# Patient Record
Sex: Male | Born: 1955
Health system: Southern US, Community
[De-identification: ages and names within clinical notes are randomized; demographics above are authoritative.]

## PROBLEM LIST (undated history)

## (undated) DIAGNOSIS — E119 Type 2 diabetes mellitus without complications: Secondary | ICD-10-CM

## (undated) DIAGNOSIS — E785 Hyperlipidemia, unspecified: Secondary | ICD-10-CM

## (undated) DIAGNOSIS — I10 Essential (primary) hypertension: Secondary | ICD-10-CM

## (undated) HISTORY — DX: Essential (primary) hypertension: I10

## (undated) HISTORY — DX: Hyperlipidemia, unspecified: E78.5

## (undated) HISTORY — DX: Type 2 diabetes mellitus without complications: E11.9

---

## 2011-04-21 DIAGNOSIS — E119 Type 2 diabetes mellitus without complications: Secondary | ICD-10-CM

## 2011-04-21 HISTORY — DX: Type 2 diabetes mellitus without complications: E11.9

## 2012-05-20 ENCOUNTER — Other Ambulatory Visit: Payer: Self-pay | Admitting: Family Medicine

## 2012-05-20 DIAGNOSIS — R109 Unspecified abdominal pain: Secondary | ICD-10-CM

## 2012-05-23 ENCOUNTER — Ambulatory Visit
Admission: RE | Admit: 2012-05-23 | Discharge: 2012-05-23 | Disposition: A | Discharge status: Home / Self Care | Payer: BC Managed Care – PPO | Source: Ambulatory Visit | Attending: Family Medicine | Admitting: Family Medicine

## 2012-05-23 DIAGNOSIS — R109 Unspecified abdominal pain: Secondary | ICD-10-CM

## 2013-11-07 ENCOUNTER — Other Ambulatory Visit: Payer: Self-pay | Admitting: *Deleted

## 2013-11-07 DIAGNOSIS — R002 Palpitations: Secondary | ICD-10-CM

## 2013-11-10 ENCOUNTER — Encounter (INDEPENDENT_AMBULATORY_CARE_PROVIDER_SITE_OTHER): Payer: BC Managed Care – PPO

## 2013-11-10 ENCOUNTER — Encounter: Payer: Self-pay | Admitting: Radiology

## 2013-11-10 DIAGNOSIS — R002 Palpitations: Secondary | ICD-10-CM

## 2013-11-10 NOTE — Progress Notes (Signed)
Patient ID: [name], male   DOB: [birth year], 58 y.o.   MRN: [id number] E cardio 24 hr holter monitor applied

## 2013-12-08 ENCOUNTER — Other Ambulatory Visit (HOSPITAL_COMMUNITY): Payer: Self-pay | Admitting: Family Medicine

## 2013-12-08 DIAGNOSIS — R002 Palpitations: Secondary | ICD-10-CM

## 2013-12-08 IMAGING — US US ABDOMEN COMPLETE
1 series · 14 of 25 positions shown · non-contrast
Comparison: None.

CLINICAL DATA: Abdominal pain

COMPLETE ABDOMINAL ULTRASOUND

[Series 1: us abdomen complete · 0.35mm/px · 14 of 87 slices shown]
[im 1/87]
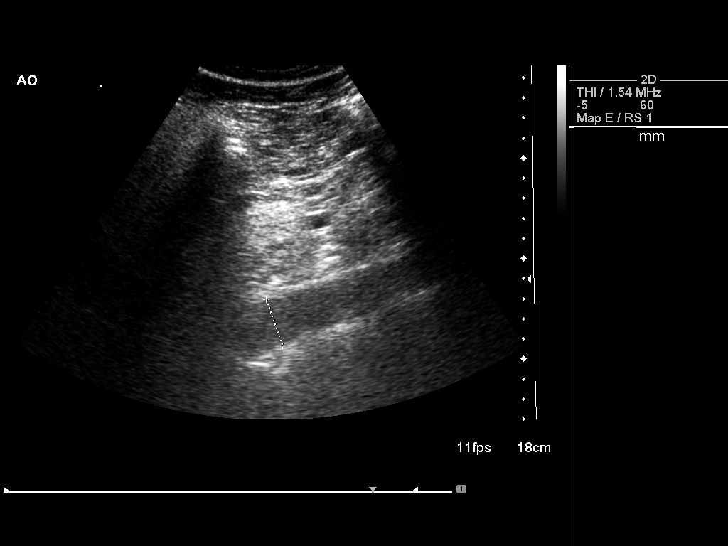
[im 8/87]
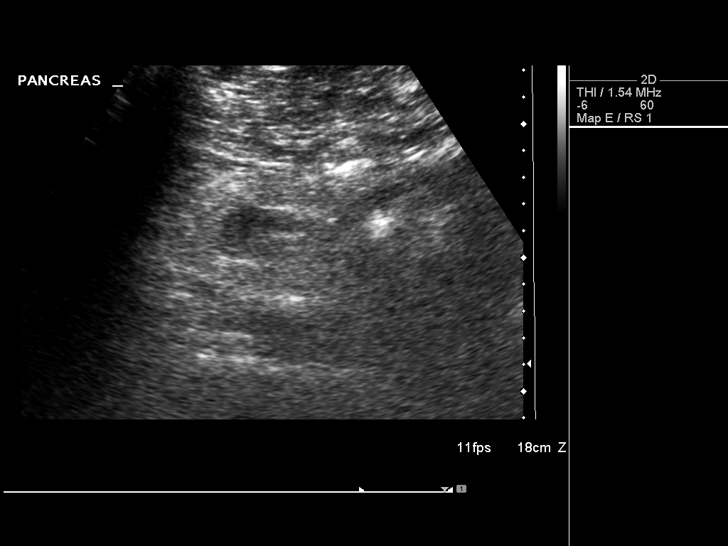
[im 15/87]
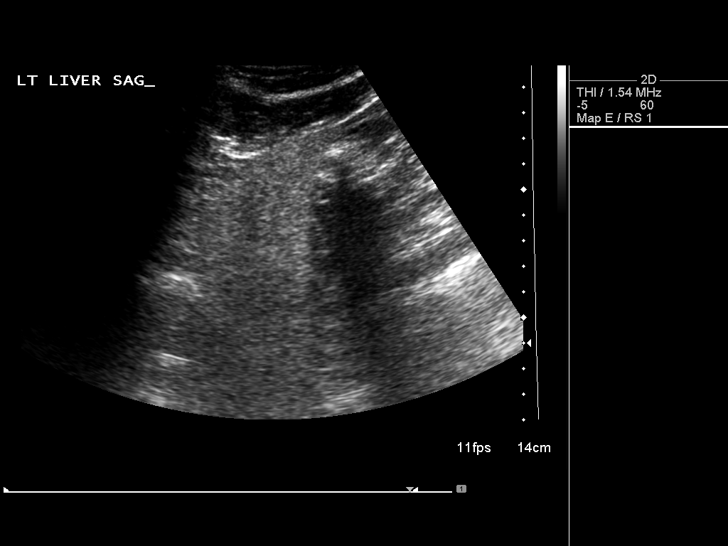
[im 22/87]
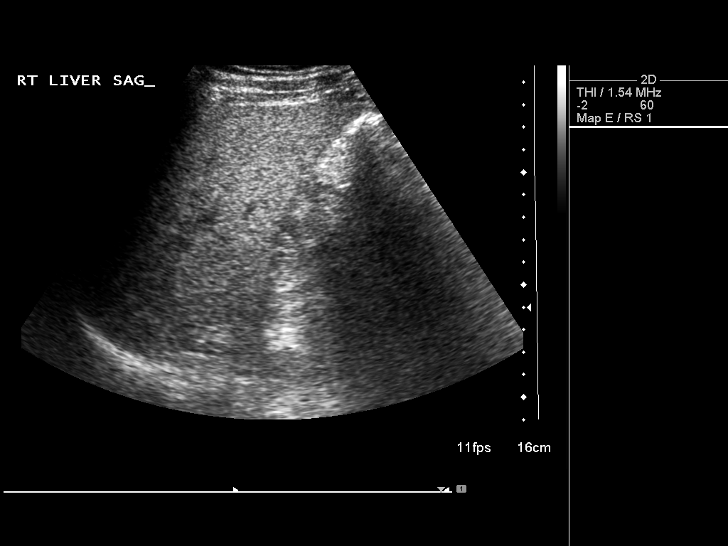
[im 29/87]
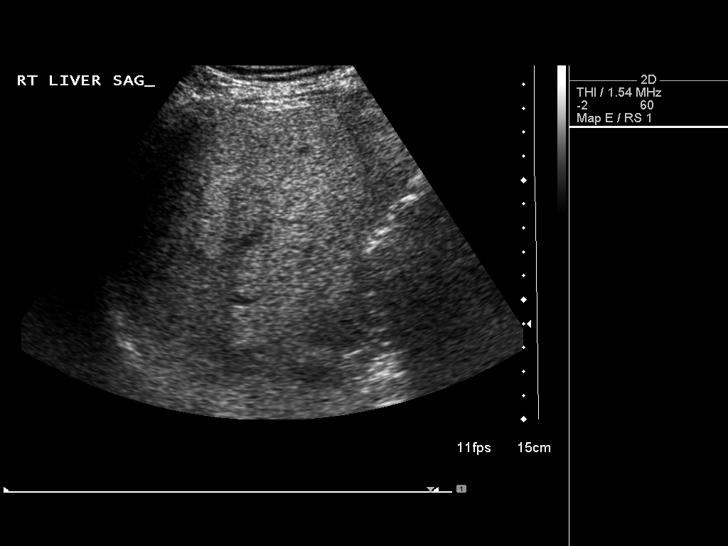
[im 33/87]
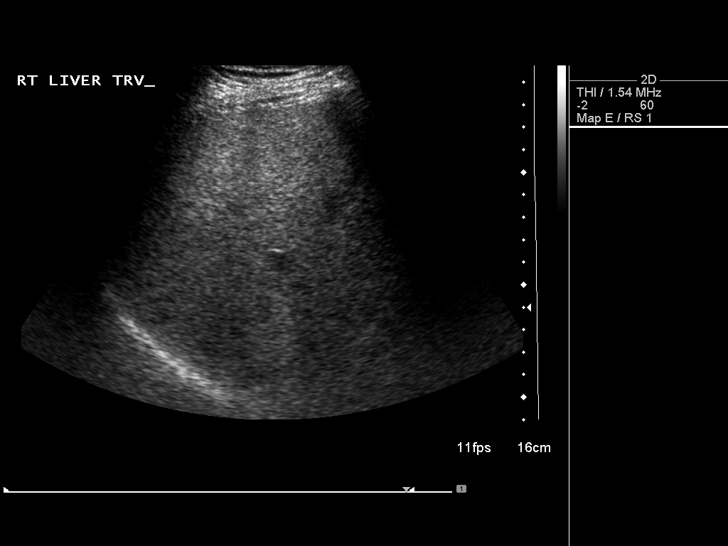
[im 40/87]
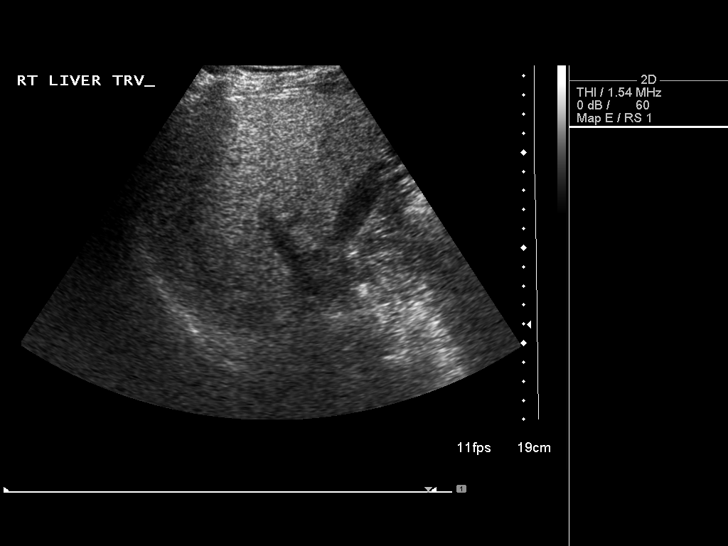
[im 47/87]
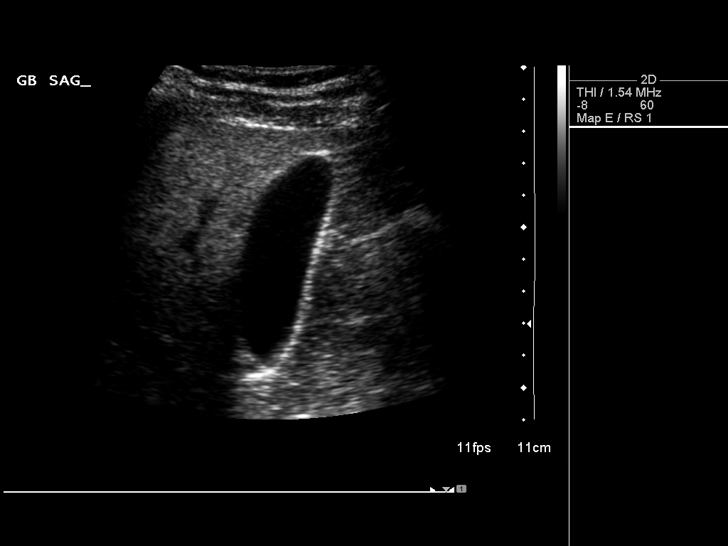
[im 54/87]
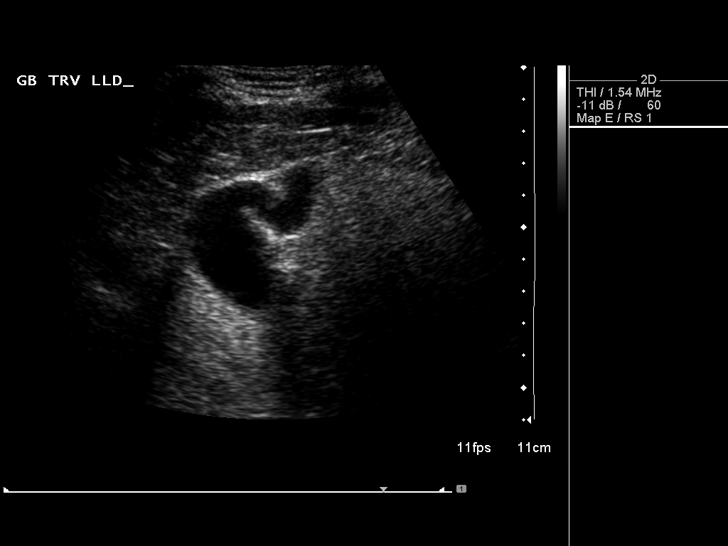
[im 58/87]
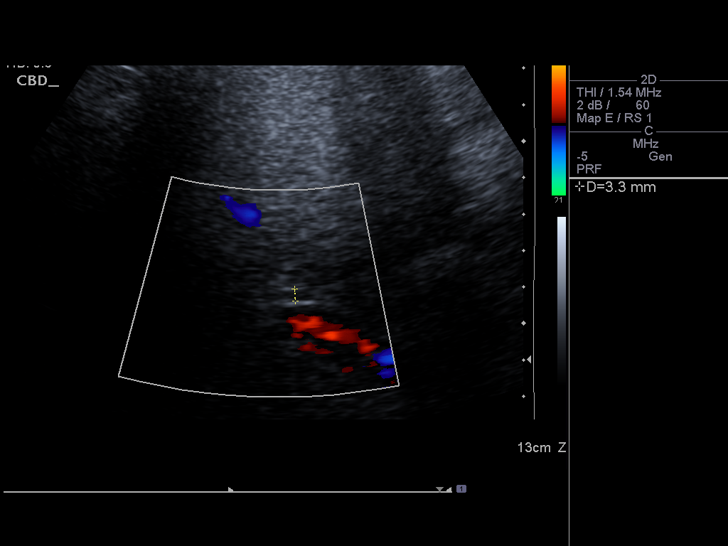
[im 65/87]
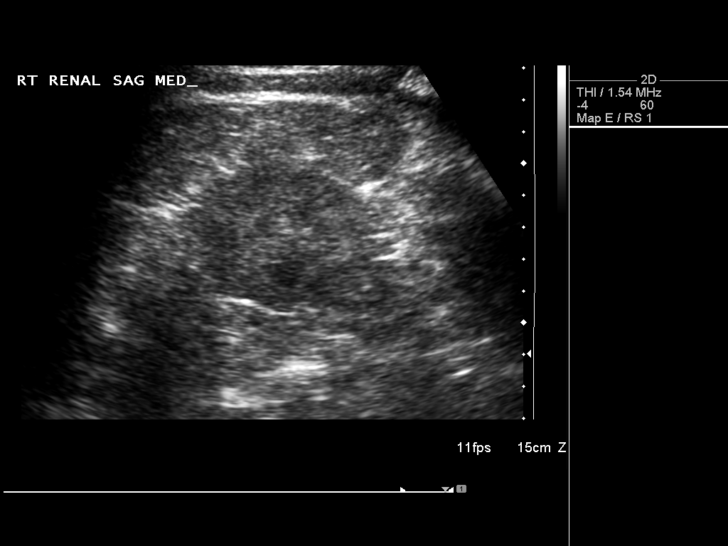
[im 72/87]
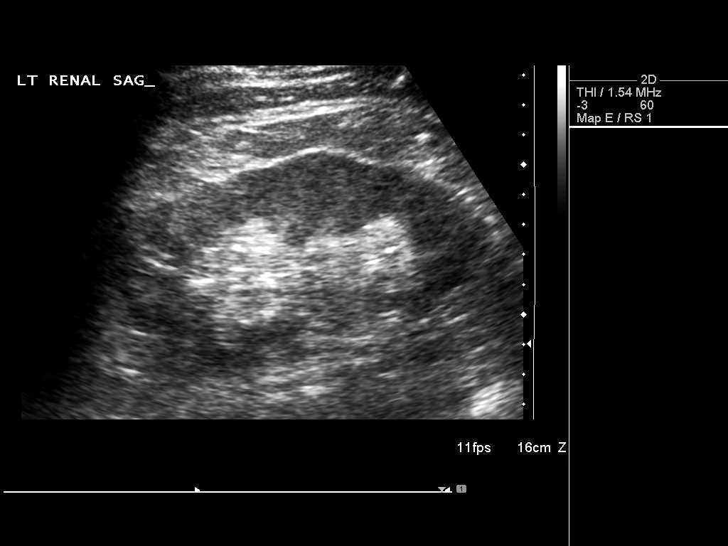
[im 79/87]
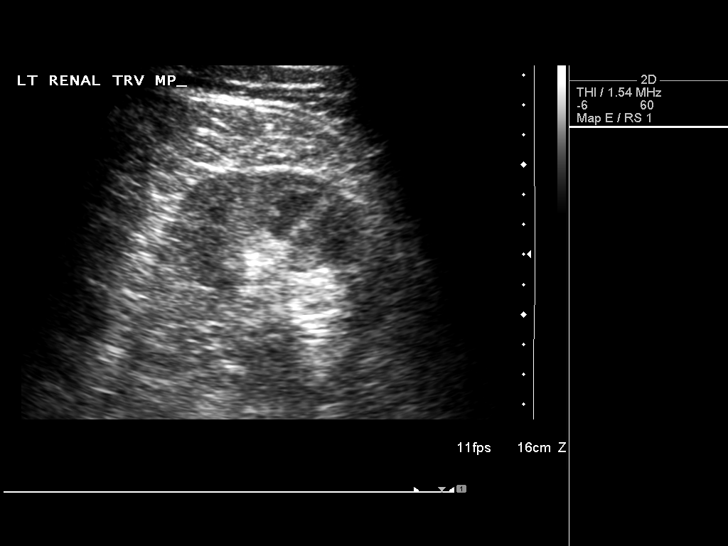
[im 87/87]
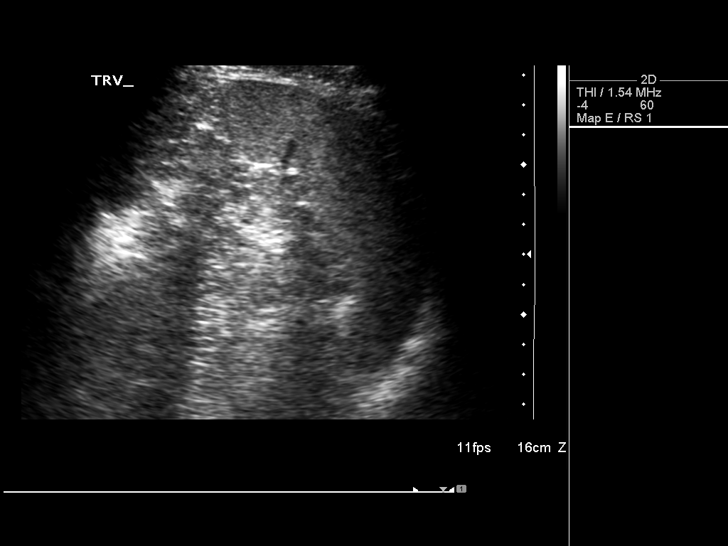

[14 of 25 positions shown; findings below may reference images not displayed]

FINDINGS: Gallbladder:  The gallbladder is visualized and no gallstones are
noted.  There is no pain over the gallbladder with compression.

Common bile duct:  The common bile duct is normal measuring 3.3 mm
in diameter.

Liver:  The liver is echogenic diffusely consistent with fatty
infiltration.  No focal abnormality is seen.

IVC:  The IVC is obscured by bowel gas.

Pancreas:  The pancreas also is largely obscured by bowel gas.

Spleen:  The spleen is normal measuring 5.9 cm sagittally.

Right Kidney:  No hydronephrosis is seen.  The right kidney
measures 10.3 cm sagittally.

Left Kidney:  No hydronephrosis is noted.  The left kidney is
slightly larger measuring 12.3 cm.

Abdominal aorta:  The abdominal aorta is normal in caliber.
IMPRESSION: 1.  Echogenic liver consistent with diffuse fatty infiltration.  No
focal abnormality.
2.  No gallstones.
3.  The left kidney is 2 cm larger than the right, of questionable
significance.

## 2013-12-18 ENCOUNTER — Inpatient Hospital Stay (HOSPITAL_COMMUNITY): Admission: RE | Admit: 2013-12-18 | Payer: BC Managed Care – PPO | Source: Ambulatory Visit

## 2014-01-05 ENCOUNTER — Institutional Professional Consult (permissible substitution): Payer: BC Managed Care – PPO | Admitting: Cardiology

## 2014-01-29 ENCOUNTER — Encounter: Payer: Self-pay | Admitting: *Deleted

## 2014-02-02 ENCOUNTER — Institutional Professional Consult (permissible substitution): Payer: BC Managed Care – PPO | Admitting: Cardiology

## 2014-04-04 ENCOUNTER — Ambulatory Visit (HOSPITAL_COMMUNITY): Payer: BC Managed Care – PPO | Attending: Family Medicine | Admitting: Cardiology

## 2014-04-04 ENCOUNTER — Other Ambulatory Visit (HOSPITAL_COMMUNITY): Payer: Self-pay | Admitting: Family Medicine

## 2014-04-04 DIAGNOSIS — E119 Type 2 diabetes mellitus without complications: Secondary | ICD-10-CM | POA: Insufficient documentation

## 2014-04-04 DIAGNOSIS — I10 Essential (primary) hypertension: Secondary | ICD-10-CM | POA: Diagnosis not present

## 2014-04-04 DIAGNOSIS — R0609 Other forms of dyspnea: Secondary | ICD-10-CM | POA: Insufficient documentation

## 2014-04-04 DIAGNOSIS — E785 Hyperlipidemia, unspecified: Secondary | ICD-10-CM | POA: Diagnosis not present

## 2014-04-04 DIAGNOSIS — R002 Palpitations: Secondary | ICD-10-CM

## 2014-04-04 NOTE — Progress Notes (Signed)
Echo performed. 

## 2014-04-24 ENCOUNTER — Telehealth: Payer: Self-pay | Admitting: Internal Medicine

## 2014-04-24 NOTE — Telephone Encounter (Signed)
[facility] [place] (Dr Lupita RaiderKimberlee Shaw) for appointment with Dr Rennis GoldenHilty on 05/03/14.  [facility] Dr Blanchie DessertHilty's schedule on 05/03/14.  lp

## 2014-05-03 ENCOUNTER — Ambulatory Visit: Payer: Self-pay | Admitting: Internal Medicine

## 2014-05-25 ENCOUNTER — Encounter: Payer: Self-pay | Admitting: Internal Medicine

## 2014-05-25 ENCOUNTER — Ambulatory Visit (INDEPENDENT_AMBULATORY_CARE_PROVIDER_SITE_OTHER): Payer: Self-pay | Admitting: Internal Medicine

## 2014-05-25 VITALS — BP 146/88 | HR 91 | Ht 69.0 in | Wt 195.0 lb

## 2014-05-25 DIAGNOSIS — I5189 Other ill-defined heart diseases: Secondary | ICD-10-CM

## 2014-05-25 DIAGNOSIS — I493 Ventricular premature depolarization: Secondary | ICD-10-CM

## 2014-05-25 DIAGNOSIS — Z8249 Family history of ischemic heart disease and other diseases of the circulatory system: Secondary | ICD-10-CM

## 2014-05-25 DIAGNOSIS — E119 Type 2 diabetes mellitus without complications: Secondary | ICD-10-CM

## 2014-05-25 DIAGNOSIS — E785 Hyperlipidemia, unspecified: Secondary | ICD-10-CM

## 2014-05-25 DIAGNOSIS — R0602 Shortness of breath: Secondary | ICD-10-CM

## 2014-05-25 DIAGNOSIS — R000 Tachycardia, unspecified: Secondary | ICD-10-CM

## 2014-05-25 DIAGNOSIS — E349 Endocrine disorder, unspecified: Secondary | ICD-10-CM

## 2014-05-25 DIAGNOSIS — Z794 Long term (current) use of insulin: Secondary | ICD-10-CM

## 2014-05-25 DIAGNOSIS — I519 Heart disease, unspecified: Secondary | ICD-10-CM

## 2014-05-25 DIAGNOSIS — R0609 Other forms of dyspnea: Secondary | ICD-10-CM

## 2014-05-25 DIAGNOSIS — E291 Testicular hypofunction: Secondary | ICD-10-CM

## 2014-05-25 DIAGNOSIS — IMO0001 Reserved for inherently not codable concepts without codable children: Secondary | ICD-10-CM

## 2014-05-25 DIAGNOSIS — I10 Essential (primary) hypertension: Secondary | ICD-10-CM

## 2014-05-25 NOTE — Patient Instructions (Signed)
Your physician has requested that you have en exercise stress myoview. For further information please visit [email or web address]. Please follow instruction sheet, as given.  Your physician recommends that you schedule a follow-up appointment after your stress test.   

## 2014-05-25 NOTE — Progress Notes (Signed)
OFFICE NOTE  Chief Complaint:  Dyspnea, indigestion, tachycardia, palpitations  Primary Care Physician: Danny Raider, MD  HPI:  [name] is a pleasant 59 year old males calmly referred to me by Dr. Clelia [name] for evaluation of progressive shortness of breath and palpitations. Last summer he had the onset of new palpitations and wore a 48-hour monitor. This demonstrated about 500 PVCs which were isolated. No nonsustained VT was noted. Subsequently had an echocardiogram which demonstrated an EF of 65-70% with diastolic dysfunction. There is no significant LV wall thickening but some mild proximal septal thickening. No clear etiology of his diastolic dysfunction other than possibly his history of insulin-dependent diabetes and hypertension. He also has risk factors for heart disease including dyslipidemia and a strong family history of heart disease. His father died of a heart attack at age 27. He also has 2 brothers with significant dyslipidemia and has a personal history of markedly elevated triglycerides. He's currently on Vytorin and fenofibrate. He reports his dyspnea has gotten significantly worse with exertion. He also reports tachycardia which starts much after minimal activity. He's also much more fatigued. He occasionally gets lightheaded or dizzy if he overexerts himself. He denies any angina.  PMHx:  Past Medical History  Diagnosis Date  . Hypertension   . Hyperlipidemia   . Diabetes mellitus, type 2 04/2011    No past surgical history on file.  FAMHx:  Family History  Problem Relation Age of Onset  . Heart attack Father 46  . Sudden death Mother 54    SOCHx:   reports that he has never smoked. He has never used smokeless tobacco. He reports that he does not drink alcohol or use illicit drugs.  ALLERGIES:  No Known Allergies  ROS: A comprehensive review of systems was negative except for: Respiratory: positive for dyspnea on exertion Cardiovascular: positive for  fatigue and palpitations Neurological: positive for dizziness  HOME MEDS: Current Outpatient Prescriptions  Medication Sig Dispense Refill  . aspirin 81 MG tablet Take 81 mg by mouth daily.    Randa Ngo 30 MG/ACT SOLN Apply 1 pump every morning after shower to each underarm  0  . Coenzyme Q10 (CO Q-10) 100 MG CAPS Take 1 capsule by mouth daily.    . fenofibrate 160 MG tablet Take 160 mg by mouth daily.    Marland Kitchen LANTUS SOLOSTAR 100 UNIT/ML Solostar Pen Take 28 Units by mouth every morning.  0  . losartan-hydrochlorothiazide (HYZAAR) 50-12.5 MG per tablet Take 1 tablet by mouth daily.    . metFORMIN (GLUCOPHAGE) 500 MG tablet Take 1 tablet by mouth 2 (two) times daily.    . Omega-3 Fatty Acids (FISH OIL) 1000 MG CAPS Take by mouth daily.    Marland Kitchen omeprazole (PRILOSEC) 20 MG capsule Take 1 capsule by mouth daily.    . TRULICITY 1.5 MG/0.5ML SOPN every 7 (seven) days.  1  . VYTORIN 10-20 MG per tablet Take 1 tablet by mouth daily.  1   No current facility-administered medications for this visit.    LABS/IMAGING: No results found for this or any previous visit (from the past 48 hour(s)). No results found.  VITALS: BP 146/88 mmHg  Pulse 91  Ht  (1.753 m)  Wt 195 lb (88.451 kg)  BMI 28.78 kg/m2  EXAM: General appearance: alert and no distress Neck: no carotid bruit and no JVD Lungs: clear to auscultation bilaterally Heart: regular rate and rhythm, S1, S2 normal, no murmur, click, rub or gallop Abdomen: soft, non-tender; bowel sounds  normal; no masses,  no organomegaly Extremities: extremities normal, atraumatic, no cyanosis or edema Pulses: 2+ and symmetric Skin: Skin color, texture, turgor normal. No rashes or lesions Neurologic: Grossly normal Psych: Pleasant  EKG: Sinus rhythm with occasional PVCs at 91, left axis deviation, PRWP, cannot rule out anterior infarct  ASSESSMENT: 1. Progressive dyspnea on exertion and PVCs 2. Abnormal EKG 3. Insulin dependent  diabetes 4. Hypertension 5. Dyslipidemia 6. Diastolic dysfunction with preserved systolic function on recent echo  PLAN: 1.   Mr. [name] is multiple coronary risk factors and abnormalities on his EKG with diastolic dysfunction on his echo. He's had progressive shortness of breath but no significant chest pain. He is also experiencing fatigue which sounds like it could be related to multivessel coronary disease. I would recommend an exercise nuclear stress test to see if we can re-create his symptoms and evaluate for ischemia as a possible cause. Plan to see him back to discuss results of the stress test when it's available.  Thanks as always for the kind referral.  Chrystie NoseKenneth C. Sharna Gabrys, MD, [facility] Attending Cardiologist [facility]  [name] C 05/25/2014, 5:23 PM

## 2014-05-30 ENCOUNTER — Telehealth (HOSPITAL_COMMUNITY): Payer: Self-pay

## 2014-05-30 NOTE — Telephone Encounter (Signed)
Encounter complete. 

## 2014-05-31 ENCOUNTER — Telehealth (HOSPITAL_COMMUNITY): Payer: Self-pay

## 2014-05-31 NOTE — Telephone Encounter (Signed)
Encounter complete. 

## 2014-06-01 ENCOUNTER — Ambulatory Visit (HOSPITAL_COMMUNITY)
Admission: RE | Admit: 2014-06-01 | Discharge: 2014-06-01 | Disposition: A | Discharge status: Home / Self Care | Payer: BLUE CROSS/BLUE SHIELD | Source: Ambulatory Visit | Attending: Cardiovascular Disease | Admitting: Cardiovascular Disease

## 2014-06-01 DIAGNOSIS — R55 Syncope and collapse: Secondary | ICD-10-CM | POA: Diagnosis not present

## 2014-06-01 DIAGNOSIS — I10 Essential (primary) hypertension: Secondary | ICD-10-CM | POA: Insufficient documentation

## 2014-06-01 DIAGNOSIS — R0602 Shortness of breath: Secondary | ICD-10-CM | POA: Diagnosis not present

## 2014-06-01 DIAGNOSIS — R002 Palpitations: Secondary | ICD-10-CM | POA: Diagnosis not present

## 2014-06-01 DIAGNOSIS — E785 Hyperlipidemia, unspecified: Secondary | ICD-10-CM | POA: Diagnosis not present

## 2014-06-01 DIAGNOSIS — R61 Generalized hyperhidrosis: Secondary | ICD-10-CM | POA: Insufficient documentation

## 2014-06-01 DIAGNOSIS — E669 Obesity, unspecified: Secondary | ICD-10-CM | POA: Diagnosis not present

## 2014-06-01 DIAGNOSIS — R9431 Abnormal electrocardiogram [ECG] [EKG]: Secondary | ICD-10-CM | POA: Diagnosis not present

## 2014-06-01 DIAGNOSIS — E118 Type 2 diabetes mellitus with unspecified complications: Secondary | ICD-10-CM | POA: Insufficient documentation

## 2014-06-01 DIAGNOSIS — Z8249 Family history of ischemic heart disease and other diseases of the circulatory system: Secondary | ICD-10-CM | POA: Diagnosis not present

## 2014-06-01 DIAGNOSIS — I5189 Other ill-defined heart diseases: Secondary | ICD-10-CM

## 2014-06-01 DIAGNOSIS — R0609 Other forms of dyspnea: Secondary | ICD-10-CM | POA: Insufficient documentation

## 2014-06-01 DIAGNOSIS — R42 Dizziness and giddiness: Secondary | ICD-10-CM | POA: Insufficient documentation

## 2014-06-01 DIAGNOSIS — R000 Tachycardia, unspecified: Secondary | ICD-10-CM

## 2014-06-01 DIAGNOSIS — IMO0001 Reserved for inherently not codable concepts without codable children: Secondary | ICD-10-CM

## 2014-06-01 DIAGNOSIS — Z794 Long term (current) use of insulin: Secondary | ICD-10-CM | POA: Diagnosis not present

## 2014-06-01 DIAGNOSIS — E119 Type 2 diabetes mellitus without complications: Secondary | ICD-10-CM

## 2014-06-01 DIAGNOSIS — I493 Ventricular premature depolarization: Secondary | ICD-10-CM

## 2014-06-01 DIAGNOSIS — I519 Heart disease, unspecified: Secondary | ICD-10-CM

## 2014-06-01 MED ORDER — TECHNETIUM TC 99M SESTAMIBI GENERIC - CARDIOLITE
10.3000 | Freq: Once | INTRAVENOUS | Status: AC | PRN
  Administered 2014-06-01: 10 via INTRAVENOUS

## 2014-06-01 MED ORDER — TECHNETIUM TC 99M SESTAMIBI GENERIC - CARDIOLITE
32.0000 | Freq: Once | INTRAVENOUS | Status: AC | PRN
  Administered 2014-06-01: 32 via INTRAVENOUS

## 2014-06-01 NOTE — Procedures (Addendum)
[facility] [address] [place] [address] [phone]  Cardiology Nuclear Med Study  Danny SkeenRoyce [name] is a 59 y.o. male     MRN : [id number]     DOB: May 23, 1955  Procedure Date: 06/01/2014  Nuclear Med Background Indication for Stress Test:  Evaluation for Ischemia and Abnormal EKG History:  No prior cardiac or respiratory history reported;No prior NUC MPI for comparison/ Cardiac Risk Factors: Family History - CAD, Hypertension, IDDM Type 2, Lipids and Overweight  Symptoms:  Diaphoresis, Dizziness, DOE, Fatigue, Light-Headedness, Near Syncope, Palpitations and SOB   Nuclear Pre-Procedure Caffeine/Decaff Intake:  7:00pm NPO After: 5:00am   IV Site: R Forearm  IV 0.9% NS with Angio Cath:  22g  Chest Size (in):  48" IV Started by: Berdie OgrenAmanda Wease, RN  Height: 5\' 9"  (1.753 m)  Cup Size: n/a  BMI:  Body mass index is 28.78 kg/(m^2). Weight:  195 lb (88.451 kg)   Tech Comments:  n/a    Nuclear Med Study 1 or 2 day study: 1 day  Stress Test Type:  Stress  Order Authorizing Provider:  Zoila ShutterKenneth Hilty, MD   Resting Radionuclide: Technetium 1838m Sestamibi  Resting Radionuclide Dose: 10.3 mCi   Stress Radionuclide:  Technetium 1338m Sestamibi  Stress Radionuclide Dose: 32.0 mCi           Stress Protocol Rest HR: 88 Stress HR: 153  Rest BP: 155/84 Stress BP: 202/97  Exercise Time (min): 9. METS: 10.1   Predicted Max HR: 163 bpm % Max HR: 93.87 bpm Rate Pressure Product: [id number]  Dose of Adenosine (mg):  n/a Dose of Lexiscan: n/a mg  Dose of Atropine (mg): n/a Dose of Dobutamine: n/a mcg/kg/min (at max HR)  Stress Test Technologist: Esperanza Sheetserry-Marie Martin, CCT Nuclear Technologist: Gonzella LexPam Phillips, CNMT   Rest Procedure:  Myocardial perfusion imaging was performed at rest 45 minutes following the intravenous administration of Technetium 4638m Sestamibi. Stress Procedure:  The patient performed treadmill exercise using a Bruce  Protocol  for 9 minutes. The patient stopped due to Fatigue, SOB and Leg Fatigue and denied any chest pain.  There were no significant ST-T wave changes.  Technetium 338m Sestamibi was injected IV at peak exercise and myocardial perfusion imaging was performed after a brief delay.  Transient Ischemic Dilatation (Normal <1.22):  0.93  QGS EDV:  68 ml QGS ESV:  23 ml LV Ejection Fraction: 67%       Rest ECG: NSR - Normal EKG  Stress ECG: No significant change from baseline ECG  QPS Raw Data Images:  Normal; no motion artifact; normal heart/lung ratio. Stress Images:  There is decreased uptake in the inferior wall. Rest Images:  There is decreased uptake in the inferior wall. Subtraction (SDS):  There is a fixed inferior defect that is most consistent with diaphragmatic attenuation.  Impression Exercise Capacity:  Good exercise capacity. BP Response:  Normal blood pressure response. Clinical Symptoms:  No significant symptoms noted. ECG Impression:  No significant ST segment change suggestive of ischemia. Comparison with Prior Nuclear Study: No images to compare  Overall Impression:  Low risk stress nuclear study Diaphragmatic attenuation.  LV Wall Motion:  NL LV Function; NL Wall Motion   Runell GessBERRY,Nastasia Kage J, MD  06/01/2014 1:50 PM

## 2014-06-06 ENCOUNTER — Ambulatory Visit: Payer: Self-pay | Admitting: Cardiology

## 2014-06-20 ENCOUNTER — Ambulatory Visit (INDEPENDENT_AMBULATORY_CARE_PROVIDER_SITE_OTHER): Payer: BLUE CROSS/BLUE SHIELD | Admitting: Internal Medicine

## 2014-06-20 ENCOUNTER — Encounter: Payer: Self-pay | Admitting: Internal Medicine

## 2014-06-20 VITALS — BP 164/86 | HR 82 | Ht 68.0 in | Wt 189.0 lb

## 2014-06-20 DIAGNOSIS — E785 Hyperlipidemia, unspecified: Secondary | ICD-10-CM

## 2014-06-20 DIAGNOSIS — I493 Ventricular premature depolarization: Secondary | ICD-10-CM

## 2014-06-20 DIAGNOSIS — E119 Type 2 diabetes mellitus without complications: Secondary | ICD-10-CM

## 2014-06-20 DIAGNOSIS — R0609 Other forms of dyspnea: Secondary | ICD-10-CM

## 2014-06-20 DIAGNOSIS — I10 Essential (primary) hypertension: Secondary | ICD-10-CM

## 2014-06-20 NOTE — Patient Instructions (Signed)
Your physician wants you to follow-up in: 1 year with Dr. Hilty. You will receive a reminder letter in the mail two months in advance. If you don't receive a letter, please call our office to schedule the follow-up appointment.  

## 2014-06-20 NOTE — Progress Notes (Signed)
OFFICE NOTE  Chief Complaint:  Follow-up stress test  Primary Care Physician: Lupita Raider, MD  HPI:  [name] is a pleasant 59 year old males calmly referred to me by Dr. Clelia Croft for evaluation of progressive shortness of breath and palpitations. Last summer he had the onset of new palpitations and wore a 48-hour monitor. This demonstrated about 500 PVCs which were isolated. No nonsustained VT was noted. Subsequently had an echocardiogram which demonstrated an EF of 65-70% with diastolic dysfunction. There is no significant LV wall thickening but some mild proximal septal thickening. No clear etiology of his diastolic dysfunction other than possibly his history of insulin-dependent diabetes and hypertension. He also has risk factors for heart disease including dyslipidemia and a strong family history of heart disease. His father died of a heart attack at age 32. He also has 2 brothers with significant dyslipidemia and has a personal history of markedly elevated triglycerides. He's currently on Vytorin and fenofibrate. He reports his dyspnea has gotten significantly worse with exertion. He also reports tachycardia which starts much after minimal activity. He's also much more fatigued. He occasionally gets lightheaded or dizzy if he overexerts himself. He denies any angina.  I the pleasure seem [name] back in the office today for follow-up of his stress test. He underwent a nuclear stress test which was negative for ischemia and showed a preserved EF of 67%. He reports he still has some palpitations from time to time. Blood pressure is elevated again today. Apparently this was noted by his primary care provider he scheduled to see her tomorrow.  PMHx:  Past Medical History  Diagnosis Date  . Hypertension   . Hyperlipidemia   . Diabetes mellitus, type 2 04/2011    No past surgical history on file.  FAMHx:  Family History  Problem Relation Age of Onset  . Heart attack Father 81  .  Sudden death Mother 23    SOCHx:   reports that he has never smoked. He has never used smokeless tobacco. He reports that he does not drink alcohol or use illicit drugs.  ALLERGIES:  No Known Allergies  ROS: A comprehensive review of systems was negative except for: Respiratory: positive for dyspnea on exertion Cardiovascular: positive for fatigue and palpitations Neurological: positive for dizziness  HOME MEDS: Current Outpatient Prescriptions  Medication Sig Dispense Refill  . aspirin 81 MG tablet Take 81 mg by mouth daily.    Randa Ngo 30 MG/ACT SOLN Apply 1 pump every morning after shower to each underarm  0  . Coenzyme Q10 (CO Q-10) 100 MG CAPS Take 1 capsule by mouth daily.    . fenofibrate 160 MG tablet Take 160 mg by mouth daily.    Marland Kitchen LANTUS SOLOSTAR 100 UNIT/ML Solostar Pen Take 28 Units by mouth every morning.  0  . losartan-hydrochlorothiazide (HYZAAR) 50-12.5 MG per tablet Take 1 tablet by mouth daily.    . metFORMIN (GLUCOPHAGE) 500 MG tablet Take 1 tablet by mouth 2 (two) times daily.    . Omega-3 Fatty Acids (FISH OIL) 1000 MG CAPS Take by mouth daily.    Marland Kitchen omeprazole (PRILOSEC) 20 MG capsule Take 1 capsule by mouth daily.    . TRULICITY 1.5 MG/0.5ML SOPN every 7 (seven) days.  1  . VYTORIN 10-20 MG per tablet Take 1 tablet by mouth daily.  1   No current facility-administered medications for this visit.    LABS/IMAGING: No results found for this or any previous visit (from the past 48 hour(s)).  No results found.  VITALS: BP 164/86 mmHg  Pulse 82  Ht 5\' 8"  (1.727 m)  Wt 189 lb (85.73 kg)  BMI 28.74 kg/m2  EXAM: Deferred  EKG: Deferred  ASSESSMENT: 1. Progressive dyspnea on exertion and PVCs - negative nuclear stress test for ischemia 2. Abnormal EKG 3. Insulin dependent diabetes 4. Hypertension 5. Dyslipidemia 6. Diastolic dysfunction with preserved systolic function on recent echo  PLAN: 1.   Mr. [name] had a low risk nuclear stress test  which was not suggestive of ischemia. He still having some PVCs. I think he would benefit from low-dose beta blocker. It's noted that he does have some diastolic dysfunction on his recent echo, which may represent small vessel coronary disease given his diabetes. He's also has persistently elevated blood pressure. I think he would benefit from going on a low-dose beta blocker. Apparently he scheduled to see his primary care provider tomorrow and I will defer starting this medication to her, however again if his blood pressure remains elevated it may be beneficial for him to start on low-dose beta blocker. He of course is not interested in being on any more medications than he thinks he needs to be.   Thanks as always for the kind referral. I would like to follow back with him annually given his risk factors.  Chrystie NoseKenneth C. Daphene Chisholm, MD, [facility] Attending Cardiologist [facility]  Vaneta Hammontree C 06/20/2014, 1:58 PM

## 2015-07-29 DIAGNOSIS — E291 Testicular hypofunction: Secondary | ICD-10-CM | POA: Diagnosis not present

## 2015-07-29 DIAGNOSIS — Z125 Encounter for screening for malignant neoplasm of prostate: Secondary | ICD-10-CM | POA: Diagnosis not present

## 2015-07-29 DIAGNOSIS — Z0000 Encounter for general adult medical examination without abnormal findings: Secondary | ICD-10-CM | POA: Diagnosis not present

## 2015-07-29 DIAGNOSIS — E782 Mixed hyperlipidemia: Secondary | ICD-10-CM | POA: Diagnosis not present

## 2015-07-29 DIAGNOSIS — E119 Type 2 diabetes mellitus without complications: Secondary | ICD-10-CM | POA: Diagnosis not present

## 2016-02-03 DIAGNOSIS — H43812 Vitreous degeneration, left eye: Secondary | ICD-10-CM | POA: Diagnosis not present

## 2016-02-12 DIAGNOSIS — Z23 Encounter for immunization: Secondary | ICD-10-CM | POA: Diagnosis not present

## 2016-02-14 DIAGNOSIS — E291 Testicular hypofunction: Secondary | ICD-10-CM | POA: Diagnosis not present

## 2016-02-14 DIAGNOSIS — I119 Hypertensive heart disease without heart failure: Secondary | ICD-10-CM | POA: Diagnosis not present

## 2016-02-14 DIAGNOSIS — E782 Mixed hyperlipidemia: Secondary | ICD-10-CM | POA: Diagnosis not present

## 2016-02-14 DIAGNOSIS — E119 Type 2 diabetes mellitus without complications: Secondary | ICD-10-CM | POA: Diagnosis not present

## 2016-05-18 DIAGNOSIS — E781 Pure hyperglyceridemia: Secondary | ICD-10-CM | POA: Diagnosis not present

## 2016-05-18 DIAGNOSIS — R5383 Other fatigue: Secondary | ICD-10-CM | POA: Diagnosis not present

## 2016-05-18 DIAGNOSIS — E782 Mixed hyperlipidemia: Secondary | ICD-10-CM | POA: Diagnosis not present

## 2016-05-18 DIAGNOSIS — E1165 Type 2 diabetes mellitus with hyperglycemia: Secondary | ICD-10-CM | POA: Diagnosis not present

## 2016-05-18 DIAGNOSIS — I119 Hypertensive heart disease without heart failure: Secondary | ICD-10-CM | POA: Diagnosis not present

## 2016-05-18 DIAGNOSIS — E291 Testicular hypofunction: Secondary | ICD-10-CM | POA: Diagnosis not present

## 2016-06-17 DIAGNOSIS — E291 Testicular hypofunction: Secondary | ICD-10-CM | POA: Diagnosis not present

## 2016-07-15 DIAGNOSIS — E291 Testicular hypofunction: Secondary | ICD-10-CM | POA: Diagnosis not present

## 2016-08-12 DIAGNOSIS — E291 Testicular hypofunction: Secondary | ICD-10-CM | POA: Diagnosis not present

## 2016-09-09 DIAGNOSIS — E291 Testicular hypofunction: Secondary | ICD-10-CM | POA: Diagnosis not present

## 2016-09-18 DIAGNOSIS — Z125 Encounter for screening for malignant neoplasm of prostate: Secondary | ICD-10-CM | POA: Diagnosis not present

## 2016-09-18 DIAGNOSIS — E1165 Type 2 diabetes mellitus with hyperglycemia: Secondary | ICD-10-CM | POA: Diagnosis not present

## 2016-09-18 DIAGNOSIS — Z0000 Encounter for general adult medical examination without abnormal findings: Secondary | ICD-10-CM | POA: Diagnosis not present

## 2016-09-18 DIAGNOSIS — E782 Mixed hyperlipidemia: Secondary | ICD-10-CM | POA: Diagnosis not present

## 2016-09-18 DIAGNOSIS — E291 Testicular hypofunction: Secondary | ICD-10-CM | POA: Diagnosis not present

## 2016-09-21 ENCOUNTER — Other Ambulatory Visit: Payer: Self-pay | Admitting: Family Medicine

## 2016-09-21 DIAGNOSIS — I5189 Other ill-defined heart diseases: Secondary | ICD-10-CM

## 2016-09-30 ENCOUNTER — Ambulatory Visit (HOSPITAL_COMMUNITY): Payer: BLUE CROSS/BLUE SHIELD | Attending: Cardiology

## 2016-09-30 ENCOUNTER — Other Ambulatory Visit: Payer: Self-pay

## 2016-09-30 ENCOUNTER — Encounter (INDEPENDENT_AMBULATORY_CARE_PROVIDER_SITE_OTHER): Payer: Self-pay

## 2016-09-30 DIAGNOSIS — I080 Rheumatic disorders of both mitral and aortic valves: Secondary | ICD-10-CM | POA: Diagnosis not present

## 2016-09-30 DIAGNOSIS — I119 Hypertensive heart disease without heart failure: Secondary | ICD-10-CM | POA: Insufficient documentation

## 2016-09-30 DIAGNOSIS — E785 Hyperlipidemia, unspecified: Secondary | ICD-10-CM | POA: Diagnosis not present

## 2016-09-30 DIAGNOSIS — I519 Heart disease, unspecified: Secondary | ICD-10-CM

## 2016-09-30 DIAGNOSIS — E119 Type 2 diabetes mellitus without complications: Secondary | ICD-10-CM | POA: Insufficient documentation

## 2016-09-30 DIAGNOSIS — I5189 Other ill-defined heart diseases: Secondary | ICD-10-CM

## 2016-10-07 DIAGNOSIS — E291 Testicular hypofunction: Secondary | ICD-10-CM | POA: Diagnosis not present

## 2016-11-04 DIAGNOSIS — E291 Testicular hypofunction: Secondary | ICD-10-CM | POA: Diagnosis not present

## 2016-12-02 DIAGNOSIS — E291 Testicular hypofunction: Secondary | ICD-10-CM | POA: Diagnosis not present

## 2016-12-30 DIAGNOSIS — E291 Testicular hypofunction: Secondary | ICD-10-CM | POA: Diagnosis not present

## 2017-01-27 DIAGNOSIS — E291 Testicular hypofunction: Secondary | ICD-10-CM | POA: Diagnosis not present

## 2017-02-24 DIAGNOSIS — E291 Testicular hypofunction: Secondary | ICD-10-CM | POA: Diagnosis not present

## 2017-02-24 DIAGNOSIS — Z23 Encounter for immunization: Secondary | ICD-10-CM | POA: Diagnosis not present

## 2017-04-02 DIAGNOSIS — J029 Acute pharyngitis, unspecified: Secondary | ICD-10-CM | POA: Diagnosis not present

## 2017-04-02 DIAGNOSIS — E1165 Type 2 diabetes mellitus with hyperglycemia: Secondary | ICD-10-CM | POA: Diagnosis not present

## 2017-04-02 DIAGNOSIS — E291 Testicular hypofunction: Secondary | ICD-10-CM | POA: Diagnosis not present

## 2017-04-02 DIAGNOSIS — I119 Hypertensive heart disease without heart failure: Secondary | ICD-10-CM | POA: Diagnosis not present

## 2017-04-02 DIAGNOSIS — E782 Mixed hyperlipidemia: Secondary | ICD-10-CM | POA: Diagnosis not present

## 2017-04-28 DIAGNOSIS — E291 Testicular hypofunction: Secondary | ICD-10-CM | POA: Diagnosis not present

## 2017-05-26 DIAGNOSIS — E291 Testicular hypofunction: Secondary | ICD-10-CM | POA: Diagnosis not present

## 2017-06-23 DIAGNOSIS — E291 Testicular hypofunction: Secondary | ICD-10-CM | POA: Diagnosis not present

## 2017-07-21 DIAGNOSIS — E291 Testicular hypofunction: Secondary | ICD-10-CM | POA: Diagnosis not present

## 2017-08-19 DIAGNOSIS — E291 Testicular hypofunction: Secondary | ICD-10-CM | POA: Diagnosis not present

## 2017-09-20 DIAGNOSIS — E291 Testicular hypofunction: Secondary | ICD-10-CM | POA: Diagnosis not present

## 2017-10-08 DIAGNOSIS — E1169 Type 2 diabetes mellitus with other specified complication: Secondary | ICD-10-CM | POA: Diagnosis not present

## 2017-10-08 DIAGNOSIS — Z0000 Encounter for general adult medical examination without abnormal findings: Secondary | ICD-10-CM | POA: Diagnosis not present

## 2017-10-08 DIAGNOSIS — Z125 Encounter for screening for malignant neoplasm of prostate: Secondary | ICD-10-CM | POA: Diagnosis not present

## 2017-10-08 DIAGNOSIS — E291 Testicular hypofunction: Secondary | ICD-10-CM | POA: Diagnosis not present

## 2017-10-08 DIAGNOSIS — E782 Mixed hyperlipidemia: Secondary | ICD-10-CM | POA: Diagnosis not present

## 2017-11-09 DIAGNOSIS — H2513 Age-related nuclear cataract, bilateral: Secondary | ICD-10-CM | POA: Diagnosis not present

## 2017-11-09 DIAGNOSIS — H43811 Vitreous degeneration, right eye: Secondary | ICD-10-CM | POA: Diagnosis not present

## 2017-11-09 DIAGNOSIS — H11153 Pinguecula, bilateral: Secondary | ICD-10-CM | POA: Diagnosis not present

## 2017-11-10 DIAGNOSIS — E291 Testicular hypofunction: Secondary | ICD-10-CM | POA: Diagnosis not present

## 2017-11-30 DIAGNOSIS — Z01818 Encounter for other preprocedural examination: Secondary | ICD-10-CM | POA: Diagnosis not present

## 2017-11-30 DIAGNOSIS — Z1211 Encounter for screening for malignant neoplasm of colon: Secondary | ICD-10-CM | POA: Diagnosis not present

## 2017-12-07 DIAGNOSIS — K640 First degree hemorrhoids: Secondary | ICD-10-CM | POA: Diagnosis not present

## 2017-12-07 DIAGNOSIS — K635 Polyp of colon: Secondary | ICD-10-CM | POA: Diagnosis not present

## 2017-12-07 DIAGNOSIS — Z1211 Encounter for screening for malignant neoplasm of colon: Secondary | ICD-10-CM | POA: Diagnosis not present

## 2017-12-08 DIAGNOSIS — E291 Testicular hypofunction: Secondary | ICD-10-CM | POA: Diagnosis not present

## 2017-12-10 DIAGNOSIS — K635 Polyp of colon: Secondary | ICD-10-CM | POA: Diagnosis not present

## 2018-01-05 DIAGNOSIS — E291 Testicular hypofunction: Secondary | ICD-10-CM | POA: Diagnosis not present

## 2018-02-02 DIAGNOSIS — E291 Testicular hypofunction: Secondary | ICD-10-CM | POA: Diagnosis not present

## 2018-02-02 DIAGNOSIS — Z23 Encounter for immunization: Secondary | ICD-10-CM | POA: Diagnosis not present

## 2018-03-02 DIAGNOSIS — E291 Testicular hypofunction: Secondary | ICD-10-CM | POA: Diagnosis not present

## 2018-03-30 DIAGNOSIS — E291 Testicular hypofunction: Secondary | ICD-10-CM | POA: Diagnosis not present

## 2018-04-29 DIAGNOSIS — E291 Testicular hypofunction: Secondary | ICD-10-CM | POA: Diagnosis not present

## 2018-04-29 DIAGNOSIS — E1169 Type 2 diabetes mellitus with other specified complication: Secondary | ICD-10-CM | POA: Diagnosis not present

## 2018-04-29 DIAGNOSIS — I119 Hypertensive heart disease without heart failure: Secondary | ICD-10-CM | POA: Diagnosis not present

## 2018-04-29 DIAGNOSIS — E782 Mixed hyperlipidemia: Secondary | ICD-10-CM | POA: Diagnosis not present

## 2018-05-18 DIAGNOSIS — N289 Disorder of kidney and ureter, unspecified: Secondary | ICD-10-CM | POA: Diagnosis not present

## 2018-05-18 DIAGNOSIS — E291 Testicular hypofunction: Secondary | ICD-10-CM | POA: Diagnosis not present

## 2018-05-26 DIAGNOSIS — C44212 Basal cell carcinoma of skin of right ear and external auricular canal: Secondary | ICD-10-CM | POA: Diagnosis not present

## 2018-05-27 DIAGNOSIS — N179 Acute kidney failure, unspecified: Secondary | ICD-10-CM | POA: Diagnosis not present

## 2018-06-08 DIAGNOSIS — E291 Testicular hypofunction: Secondary | ICD-10-CM | POA: Diagnosis not present

## 2018-06-13 DIAGNOSIS — I119 Hypertensive heart disease without heart failure: Secondary | ICD-10-CM | POA: Diagnosis not present

## 2018-06-16 DIAGNOSIS — C44212 Basal cell carcinoma of skin of right ear and external auricular canal: Secondary | ICD-10-CM | POA: Diagnosis not present

## 2018-06-29 DIAGNOSIS — E291 Testicular hypofunction: Secondary | ICD-10-CM | POA: Diagnosis not present

## 2018-07-20 DIAGNOSIS — E291 Testicular hypofunction: Secondary | ICD-10-CM | POA: Diagnosis not present

## 2018-07-20 DIAGNOSIS — N179 Acute kidney failure, unspecified: Secondary | ICD-10-CM | POA: Diagnosis not present

## 2018-08-10 DIAGNOSIS — E291 Testicular hypofunction: Secondary | ICD-10-CM | POA: Diagnosis not present

## 2018-08-31 DIAGNOSIS — E291 Testicular hypofunction: Secondary | ICD-10-CM | POA: Diagnosis not present

## 2018-09-21 DIAGNOSIS — E291 Testicular hypofunction: Secondary | ICD-10-CM | POA: Diagnosis not present

## 2018-10-12 DIAGNOSIS — E291 Testicular hypofunction: Secondary | ICD-10-CM | POA: Diagnosis not present

## 2018-10-27 DIAGNOSIS — E1169 Type 2 diabetes mellitus with other specified complication: Secondary | ICD-10-CM | POA: Diagnosis not present

## 2018-10-27 DIAGNOSIS — Z125 Encounter for screening for malignant neoplasm of prostate: Secondary | ICD-10-CM | POA: Diagnosis not present

## 2018-10-27 DIAGNOSIS — E782 Mixed hyperlipidemia: Secondary | ICD-10-CM | POA: Diagnosis not present

## 2018-10-27 DIAGNOSIS — E291 Testicular hypofunction: Secondary | ICD-10-CM | POA: Diagnosis not present

## 2018-10-28 DIAGNOSIS — Z0000 Encounter for general adult medical examination without abnormal findings: Secondary | ICD-10-CM | POA: Diagnosis not present

## 2018-11-02 DIAGNOSIS — E291 Testicular hypofunction: Secondary | ICD-10-CM | POA: Diagnosis not present

## 2018-11-02 DIAGNOSIS — N179 Acute kidney failure, unspecified: Secondary | ICD-10-CM | POA: Diagnosis not present

## 2018-12-13 DIAGNOSIS — H11153 Pinguecula, bilateral: Secondary | ICD-10-CM | POA: Diagnosis not present

## 2018-12-13 DIAGNOSIS — H35033 Hypertensive retinopathy, bilateral: Secondary | ICD-10-CM | POA: Diagnosis not present

## 2018-12-13 DIAGNOSIS — H2513 Age-related nuclear cataract, bilateral: Secondary | ICD-10-CM | POA: Diagnosis not present

## 2018-12-13 DIAGNOSIS — E119 Type 2 diabetes mellitus without complications: Secondary | ICD-10-CM | POA: Diagnosis not present

## 2018-12-31 DIAGNOSIS — Z23 Encounter for immunization: Secondary | ICD-10-CM | POA: Diagnosis not present

## 2019-08-08 DIAGNOSIS — E1169 Type 2 diabetes mellitus with other specified complication: Secondary | ICD-10-CM | POA: Diagnosis not present

## 2019-08-08 DIAGNOSIS — E291 Testicular hypofunction: Secondary | ICD-10-CM | POA: Diagnosis not present

## 2019-08-08 DIAGNOSIS — E782 Mixed hyperlipidemia: Secondary | ICD-10-CM | POA: Diagnosis not present

## 2019-11-02 DIAGNOSIS — E1169 Type 2 diabetes mellitus with other specified complication: Secondary | ICD-10-CM | POA: Diagnosis not present

## 2019-11-02 DIAGNOSIS — I119 Hypertensive heart disease without heart failure: Secondary | ICD-10-CM | POA: Diagnosis not present

## 2019-11-02 DIAGNOSIS — E782 Mixed hyperlipidemia: Secondary | ICD-10-CM | POA: Diagnosis not present

## 2019-11-02 DIAGNOSIS — E291 Testicular hypofunction: Secondary | ICD-10-CM | POA: Diagnosis not present

## 2019-11-02 DIAGNOSIS — R05 Cough: Secondary | ICD-10-CM | POA: Diagnosis not present

## 2019-11-02 DIAGNOSIS — Z0000 Encounter for general adult medical examination without abnormal findings: Secondary | ICD-10-CM | POA: Diagnosis not present

## 2019-11-02 DIAGNOSIS — Z125 Encounter for screening for malignant neoplasm of prostate: Secondary | ICD-10-CM | POA: Diagnosis not present

## 2020-01-27 DIAGNOSIS — Z23 Encounter for immunization: Secondary | ICD-10-CM | POA: Diagnosis not present

## 2020-02-19 ENCOUNTER — Ambulatory Visit: Payer: BC Managed Care – PPO | Attending: Internal Medicine

## 2020-02-19 DIAGNOSIS — Z23 Encounter for immunization: Secondary | ICD-10-CM

## 2020-02-19 NOTE — Progress Notes (Signed)
   Covid-19 Vaccination Clinic  Name:  Harrol Novello    MRN: [id number] DOB: 1955/08/01  02/19/2020  Mr. Kuriakose was observed post Covid-19 immunization for 15 minutes without incident. He was provided with Vaccine Information Sheet and instruction to access the V-Safe system.   Mr. Mcadory was instructed to call 911 with any severe reactions post vaccine: Marland Kitchen Difficulty breathing  . Swelling of face and throat  . A fast heartbeat  . A bad rash all over body  . Dizziness and weakness

## 2020-03-07 DIAGNOSIS — M109 Gout, unspecified: Secondary | ICD-10-CM | POA: Diagnosis not present

## 2020-03-07 DIAGNOSIS — I119 Hypertensive heart disease without heart failure: Secondary | ICD-10-CM | POA: Diagnosis not present

## 2020-05-29 DIAGNOSIS — R8290 Unspecified abnormal findings in urine: Secondary | ICD-10-CM | POA: Diagnosis not present

## 2020-07-11 DIAGNOSIS — E1169 Type 2 diabetes mellitus with other specified complication: Secondary | ICD-10-CM | POA: Diagnosis not present

## 2020-07-11 DIAGNOSIS — E782 Mixed hyperlipidemia: Secondary | ICD-10-CM | POA: Diagnosis not present

## 2020-07-11 DIAGNOSIS — E291 Testicular hypofunction: Secondary | ICD-10-CM | POA: Diagnosis not present

## 2020-07-11 DIAGNOSIS — I119 Hypertensive heart disease without heart failure: Secondary | ICD-10-CM | POA: Diagnosis not present

## 2020-07-11 DIAGNOSIS — N183 Chronic kidney disease, stage 3 unspecified: Secondary | ICD-10-CM | POA: Diagnosis not present

## 2020-07-11 DIAGNOSIS — I519 Heart disease, unspecified: Secondary | ICD-10-CM | POA: Diagnosis not present

## 2020-09-17 DIAGNOSIS — R809 Proteinuria, unspecified: Secondary | ICD-10-CM | POA: Diagnosis not present

## 2020-09-17 DIAGNOSIS — M549 Dorsalgia, unspecified: Secondary | ICD-10-CM | POA: Diagnosis not present

## 2020-09-19 ENCOUNTER — Ambulatory Visit: Payer: BC Managed Care – PPO | Admitting: Podiatry

## 2020-09-19 ENCOUNTER — Other Ambulatory Visit: Payer: Self-pay

## 2020-09-19 DIAGNOSIS — B351 Tinea unguium: Secondary | ICD-10-CM | POA: Diagnosis not present

## 2020-09-19 DIAGNOSIS — L603 Nail dystrophy: Secondary | ICD-10-CM

## 2020-09-21 NOTE — Progress Notes (Signed)
Subjective:  Patient ID: [name], male    DOB: [birth year],  MRN: [id number] HPI Chief Complaint  Patient presents with  . Nail Problem    Left 1st toenail discoloration "few years", non-painful. Pt states he has tried multiple otc meds without any success and only minor improvement.    65 y.o. male presents with the above complaint.   ROS: Denies fever chills nausea vomiting muscle aches pains calf pain back pain chest pain shortness of breath.  Past Medical History:  Diagnosis Date  . Diabetes mellitus, type 2 (HCC) 04/2011  . Hyperlipidemia   . Hypertension      Current Outpatient Medications:  .  amLODipine (NORVASC) 10 MG tablet, Take 0.5 tablets by mouth daily., Disp: , Rfl:  .  aspirin 81 MG tablet, Take 81 mg by mouth daily., Disp: , Rfl:  .  B-D 3CC LUER-LOK SYR 23GX1" 23G X 1" 3 ML MISC, USE AS DIRECTED EVERY 3 WEEKS 21, Disp: , Rfl:  .  BD PEN NEEDLE NANO 2ND GEN 32G X 4 MM MISC, See admin instructions., Disp: , Rfl:  .  carvedilol (COREG) 6.25 MG tablet, Take 3.125 mg by mouth 2 (two) times daily., Disp: , Rfl:  .  colchicine 0.6 MG tablet, Take by mouth., Disp: , Rfl:  .  fenofibrate 160 MG tablet, Take 160 mg by mouth daily., Disp: , Rfl:  .  hydrochlorothiazide (HYDRODIURIL) 12.5 MG tablet, Take 12.5 mg by mouth daily., Disp: , Rfl:  .  losartan-hydrochlorothiazide (HYZAAR) 50-12.5 MG per tablet, Take 1 tablet by mouth daily., Disp: , Rfl:  .  metFORMIN (GLUCOPHAGE) 500 MG tablet, Take 1 tablet by mouth 2 (two) times daily., Disp: , Rfl:  .  omega-3 acid ethyl esters (LOVAZA) 1 g capsule, Take 2 capsules by mouth 2 (two) times daily., Disp: , Rfl:  .  Omega-3 Fatty Acids (FISH OIL) 1000 MG CAPS, Take by mouth daily., Disp: , Rfl:  .  omeprazole (PRILOSEC) 20 MG capsule, Take 1 capsule by mouth daily., Disp: , Rfl:  .  pravastatin (PRAVACHOL) 40 MG tablet, Take 40 mg by mouth daily., Disp: , Rfl:  .  testosterone cypionate (DEPOTESTOSTERONE CYPIONATE) 200  MG/ML injection, SMARTSIG:1 Milliliter(s) IM Every 3 Weeks, Disp: , Rfl:  .  TRESIBA FLEXTOUCH 200 UNIT/ML FlexTouch Pen, SMARTSIG:0-130 Unit(s) SUB-Q Daily, Disp: , Rfl:  .  VYTORIN 10-20 MG per tablet, Take 1 tablet by mouth daily. (Patient not taking: Reported on 09/19/2020), Disp: , Rfl: 1  No Known Allergies Review of Systems Objective:  There were no vitals filed for this visit.  General: Well developed, nourished, in no acute distress, alert and oriented x3   Dermatological: Skin is warm, dry and supple bilateral. Nails x 10 are well maintained; remaining integument appears unremarkable at this time. There are no open sores, no preulcerative lesions, no rash or signs of infection present.  Hallux nail demonstrates thickening discoloration with friable subungual debris consistent with nail dystrophy as well as possible dermatophytic infection.   Vascular: Dorsalis Pedis artery and Posterior Tibial artery pedal pulses are 2/4 bilateral with immedate capillary fill time. Pedal hair growth present. No varicosities and no lower extremity edema present bilateral.   Neruologic: Grossly intact via light touch bilateral. Vibratory intact via tuning fork bilateral. Protective threshold with Semmes Wienstein monofilament intact to all pedal sites bilateral. Patellar and Achilles deep tendon reflexes 2+ bilateral. No Babinski or clonus noted bilateral.   Musculoskeletal: No gross boney pedal deformities bilateral. No  pain, crepitus, or limitation noted with foot and ankle range of motion bilateral. Muscular strength 5/5 in all groups tested bilateral.  Gait: Unassisted, Nonantalgic.    Radiographs:  None taken  Assessment & Plan:   Assessment: Nail dystrophy  Plan: Samples of skin and nail were taken today for pathologic evaluation we will follow-up with him in 1 month for results.     Arwa Yero T. [place], [place]

## 2020-10-01 DIAGNOSIS — R809 Proteinuria, unspecified: Secondary | ICD-10-CM | POA: Diagnosis not present

## 2020-10-24 ENCOUNTER — Other Ambulatory Visit: Payer: Self-pay

## 2020-10-24 ENCOUNTER — Ambulatory Visit: Payer: BC Managed Care – PPO | Admitting: Podiatry

## 2020-10-24 DIAGNOSIS — Z79899 Other long term (current) drug therapy: Secondary | ICD-10-CM | POA: Diagnosis not present

## 2020-10-24 DIAGNOSIS — L603 Nail dystrophy: Secondary | ICD-10-CM

## 2020-10-24 MED ORDER — TERBINAFINE HCL 250 MG PO TABS: 250.0000 mg | ORAL_TABLET | Freq: Every day | ORAL | 0 refills | Status: DC

## 2020-10-25 LAB — COMPREHENSIVE METABOLIC PANEL
AG Ratio: 1.8 (calc) (ref 1.0–2.5)
ALT: 40 U/L (ref 9–46)
AST: 31 U/L (ref 10–35)
Albumin: 4.3 g/dL (ref 3.6–5.1)
Alkaline phosphatase (APISO): 38 U/L (ref 35–144)
BUN/Creatinine Ratio: 14 (calc) (ref 6–22)
BUN: 18 mg/dL (ref 7–25)
CO2: 24 mmol/L (ref 20–32)
Calcium: 9.7 mg/dL (ref 8.6–10.3)
Chloride: 104 mmol/L (ref 98–110)
Creat: 1.29 mg/dL — ABNORMAL HIGH (ref 0.70–1.25)
Globulin: 2.4 g/dL (calc) (ref 1.9–3.7)
Glucose, Bld: 120 mg/dL — ABNORMAL HIGH (ref 65–99)
Potassium: 4.3 mmol/L (ref 3.5–5.3)
Sodium: 139 mmol/L (ref 135–146)
Total Bilirubin: 0.5 mg/dL (ref 0.2–1.2)
Total Protein: 6.7 g/dL (ref 6.1–8.1)

## 2020-10-25 NOTE — Progress Notes (Signed)
He presents today for follow-up of his nail dystrophy.  Objective: Pathology demonstrates onychomycosis tinea rubrum.  Assessment onychomycosis.  Plan: Discussed etiology pathology conservative versus surgical therapies.  We discussed topical therapy versus laser therapy versus oral therapy and the likelihood of clearing this with each modality.  At this point he would like to consider Lamisil therapy 250 mg tablets 1 daily for the next 30 days at which time another liver test to be performed.  We did provide him with a requisition for a complete metabolic panel to be done immediately.  Should this come back abnormal we will notify him immediately.  Otherwise I will see him in 30 days.

## 2020-11-01 ENCOUNTER — Other Ambulatory Visit: Payer: Self-pay | Admitting: Podiatry

## 2020-11-07 DIAGNOSIS — Z0000 Encounter for general adult medical examination without abnormal findings: Secondary | ICD-10-CM | POA: Diagnosis not present

## 2020-11-07 DIAGNOSIS — E291 Testicular hypofunction: Secondary | ICD-10-CM | POA: Diagnosis not present

## 2020-11-07 DIAGNOSIS — I119 Hypertensive heart disease without heart failure: Secondary | ICD-10-CM | POA: Diagnosis not present

## 2020-11-07 DIAGNOSIS — Z125 Encounter for screening for malignant neoplasm of prostate: Secondary | ICD-10-CM | POA: Diagnosis not present

## 2020-11-07 DIAGNOSIS — E782 Mixed hyperlipidemia: Secondary | ICD-10-CM | POA: Diagnosis not present

## 2020-11-07 DIAGNOSIS — E1169 Type 2 diabetes mellitus with other specified complication: Secondary | ICD-10-CM | POA: Diagnosis not present

## 2020-11-28 ENCOUNTER — Ambulatory Visit: Payer: BC Managed Care – PPO | Admitting: Podiatry

## 2020-11-28 ENCOUNTER — Encounter: Payer: Self-pay | Admitting: Podiatry

## 2020-11-28 ENCOUNTER — Other Ambulatory Visit: Payer: Self-pay

## 2020-11-28 DIAGNOSIS — Z79899 Other long term (current) drug therapy: Secondary | ICD-10-CM | POA: Diagnosis not present

## 2020-11-28 DIAGNOSIS — L603 Nail dystrophy: Secondary | ICD-10-CM

## 2020-11-28 MED ORDER — TERBINAFINE HCL 250 MG PO TABS: 250.0000 mg | ORAL_TABLET | Freq: Every day | ORAL | 0 refills | Status: DC

## 2020-11-29 LAB — COMPREHENSIVE METABOLIC PANEL
AG Ratio: 1.9 (calc) (ref 1.0–2.5)
ALT: 30 U/L (ref 9–46)
AST: 22 U/L (ref 10–35)
Albumin: 4.4 g/dL (ref 3.6–5.1)
Alkaline phosphatase (APISO): 41 U/L (ref 35–144)
BUN: 20 mg/dL (ref 7–25)
CO2: 27 mmol/L (ref 20–32)
Calcium: 9.5 mg/dL (ref 8.6–10.3)
Chloride: 102 mmol/L (ref 98–110)
Creat: 1.21 mg/dL (ref 0.70–1.35)
Globulin: 2.3 g/dL (calc) (ref 1.9–3.7)
Glucose, Bld: 143 mg/dL — ABNORMAL HIGH (ref 65–99)
Potassium: 4.6 mmol/L (ref 3.5–5.3)
Sodium: 137 mmol/L (ref 135–146)
Total Bilirubin: 0.4 mg/dL (ref 0.2–1.2)
Total Protein: 6.7 g/dL (ref 6.1–8.1)

## 2020-11-30 NOTE — Progress Notes (Signed)
Presents today for follow-up of nail fungus after utilizing Lamisil for the first 30 days.  Completed 30 days of Lamisil with no side effects.  Denies fever chills nausea vomiting muscle aches pains rashes or itching.  Objective: No change in nail plates as of yet.  Assessment: Long-term therapy with Lamisil.  Plan: Wrote another prescription for Lamisil 90 #1 p.o. daily and requested blood work.  Should this come back abnormal I will notify him immediately otherwise I will see him in 4 months

## 2021-02-05 DIAGNOSIS — Z23 Encounter for immunization: Secondary | ICD-10-CM | POA: Diagnosis not present

## 2021-03-06 ENCOUNTER — Other Ambulatory Visit: Payer: Self-pay | Admitting: Podiatry

## 2021-04-03 ENCOUNTER — Other Ambulatory Visit: Payer: Self-pay

## 2021-04-03 ENCOUNTER — Ambulatory Visit: Payer: BC Managed Care – PPO | Admitting: Podiatry

## 2021-04-03 ENCOUNTER — Encounter: Payer: Self-pay | Admitting: Podiatry

## 2021-04-03 DIAGNOSIS — L603 Nail dystrophy: Secondary | ICD-10-CM

## 2021-04-03 MED ORDER — TERBINAFINE HCL 250 MG PO TABS: 250.0000 mg | ORAL_TABLET | Freq: Every day | ORAL | 0 refills | Status: AC

## 2021-04-05 NOTE — Progress Notes (Signed)
He presents today for follow-up of his Lamisil therapy.  He states that he has completed his first 30 days 120 days and he states that is getting there is looking much better.  Objective: Vital signs are stable alert and orient x3 his nails are clearing probably about 25 to 30%.  Assessment: Resolving onychomycosis.  Plan: We will start him on 30 tablets 1 every other day and I will follow-up with him in 3 months.  Should he have questions or concerns he will notify me immediately.  Lamisil 250 mg tablets.

## 2021-05-03 ENCOUNTER — Other Ambulatory Visit: Payer: Self-pay | Admitting: Podiatry

## 2021-05-13 DIAGNOSIS — E782 Mixed hyperlipidemia: Secondary | ICD-10-CM | POA: Diagnosis not present

## 2021-05-13 DIAGNOSIS — I119 Hypertensive heart disease without heart failure: Secondary | ICD-10-CM | POA: Diagnosis not present

## 2021-05-13 DIAGNOSIS — E291 Testicular hypofunction: Secondary | ICD-10-CM | POA: Diagnosis not present

## 2021-05-13 DIAGNOSIS — E1169 Type 2 diabetes mellitus with other specified complication: Secondary | ICD-10-CM | POA: Diagnosis not present

## 2021-05-13 DIAGNOSIS — N183 Chronic kidney disease, stage 3 unspecified: Secondary | ICD-10-CM | POA: Diagnosis not present

## 2021-05-16 DIAGNOSIS — E119 Type 2 diabetes mellitus without complications: Secondary | ICD-10-CM | POA: Diagnosis not present

## 2021-05-16 DIAGNOSIS — H2513 Age-related nuclear cataract, bilateral: Secondary | ICD-10-CM | POA: Diagnosis not present

## 2021-05-16 DIAGNOSIS — H43813 Vitreous degeneration, bilateral: Secondary | ICD-10-CM | POA: Diagnosis not present

## 2021-05-16 DIAGNOSIS — H35033 Hypertensive retinopathy, bilateral: Secondary | ICD-10-CM | POA: Diagnosis not present

## 2021-07-03 ENCOUNTER — Ambulatory Visit: Payer: BC Managed Care – PPO | Admitting: Podiatry

## 2021-09-19 DIAGNOSIS — Z23 Encounter for immunization: Secondary | ICD-10-CM | POA: Diagnosis not present

## 2021-12-01 DIAGNOSIS — Z1159 Encounter for screening for other viral diseases: Secondary | ICD-10-CM | POA: Diagnosis not present

## 2021-12-01 DIAGNOSIS — Z0000 Encounter for general adult medical examination without abnormal findings: Secondary | ICD-10-CM | POA: Diagnosis not present

## 2021-12-01 DIAGNOSIS — E1169 Type 2 diabetes mellitus with other specified complication: Secondary | ICD-10-CM | POA: Diagnosis not present

## 2021-12-01 DIAGNOSIS — E291 Testicular hypofunction: Secondary | ICD-10-CM | POA: Diagnosis not present

## 2021-12-01 DIAGNOSIS — E782 Mixed hyperlipidemia: Secondary | ICD-10-CM | POA: Diagnosis not present

## 2021-12-01 DIAGNOSIS — Z23 Encounter for immunization: Secondary | ICD-10-CM | POA: Diagnosis not present

## 2021-12-01 DIAGNOSIS — Z125 Encounter for screening for malignant neoplasm of prostate: Secondary | ICD-10-CM | POA: Diagnosis not present

## 2021-12-01 DIAGNOSIS — I119 Hypertensive heart disease without heart failure: Secondary | ICD-10-CM | POA: Diagnosis not present

## 2021-12-01 DIAGNOSIS — N529 Male erectile dysfunction, unspecified: Secondary | ICD-10-CM | POA: Diagnosis not present

## 2022-02-07 DIAGNOSIS — Z23 Encounter for immunization: Secondary | ICD-10-CM | POA: Diagnosis not present

## 2022-05-19 DIAGNOSIS — H35033 Hypertensive retinopathy, bilateral: Secondary | ICD-10-CM | POA: Diagnosis not present

## 2022-05-19 DIAGNOSIS — H2513 Age-related nuclear cataract, bilateral: Secondary | ICD-10-CM | POA: Diagnosis not present

## 2022-05-19 DIAGNOSIS — H43813 Vitreous degeneration, bilateral: Secondary | ICD-10-CM | POA: Diagnosis not present

## 2022-05-19 DIAGNOSIS — E119 Type 2 diabetes mellitus without complications: Secondary | ICD-10-CM | POA: Diagnosis not present

## 2022-06-03 DIAGNOSIS — E782 Mixed hyperlipidemia: Secondary | ICD-10-CM | POA: Diagnosis not present

## 2022-06-03 DIAGNOSIS — E291 Testicular hypofunction: Secondary | ICD-10-CM | POA: Diagnosis not present

## 2022-06-03 DIAGNOSIS — N183 Chronic kidney disease, stage 3 unspecified: Secondary | ICD-10-CM | POA: Diagnosis not present

## 2022-06-03 DIAGNOSIS — E1169 Type 2 diabetes mellitus with other specified complication: Secondary | ICD-10-CM | POA: Diagnosis not present

## 2022-06-03 DIAGNOSIS — I119 Hypertensive heart disease without heart failure: Secondary | ICD-10-CM | POA: Diagnosis not present

## 2022-06-03 DIAGNOSIS — Z23 Encounter for immunization: Secondary | ICD-10-CM | POA: Diagnosis not present

## 2023-07-30 ENCOUNTER — Encounter (INDEPENDENT_AMBULATORY_CARE_PROVIDER_SITE_OTHER): Payer: Self-pay

## 2023-07-30 ENCOUNTER — Telehealth (INDEPENDENT_AMBULATORY_CARE_PROVIDER_SITE_OTHER): Payer: Self-pay | Admitting: Otolaryngology

## 2023-07-30 NOTE — Telephone Encounter (Signed)
 Called patient's home phone and mobile numbers. LVM on both.  Sent patient a MyChart msg to call our office to schedule.  Was going to offer 08/10/2023 @ 2:30pm to see Dr.Leroux and 3pm to see Dr. Allena Katz if still available.

## 2023-07-30 NOTE — Telephone Encounter (Signed)
 Correction: ENT Provider would be Burna Forts, PA on 08/10/2023 @ 3pm .

## 2023-08-02 ENCOUNTER — Encounter (INDEPENDENT_AMBULATORY_CARE_PROVIDER_SITE_OTHER): Payer: Self-pay

## 2023-08-26 ENCOUNTER — Ambulatory Visit (INDEPENDENT_AMBULATORY_CARE_PROVIDER_SITE_OTHER): Admitting: Physician Assistant

## 2023-08-26 ENCOUNTER — Encounter (INDEPENDENT_AMBULATORY_CARE_PROVIDER_SITE_OTHER): Payer: Self-pay | Admitting: Physician Assistant

## 2023-08-26 ENCOUNTER — Ambulatory Visit (INDEPENDENT_AMBULATORY_CARE_PROVIDER_SITE_OTHER): Admitting: Audiology

## 2023-08-26 VITALS — BP 175/75 | HR 67 | Ht 69.0 in | Wt 195.0 lb

## 2023-08-26 DIAGNOSIS — H903 Sensorineural hearing loss, bilateral: Secondary | ICD-10-CM

## 2023-08-26 DIAGNOSIS — H6993 Unspecified Eustachian tube disorder, bilateral: Secondary | ICD-10-CM

## 2023-08-26 DIAGNOSIS — H6990 Unspecified Eustachian tube disorder, unspecified ear: Secondary | ICD-10-CM

## 2023-08-26 MED ORDER — LORATADINE 10 MG PO TABS: 10.0000 mg | ORAL_TABLET | Freq: Every day | ORAL | 11 refills | Status: AC

## 2023-08-26 MED ORDER — FLUTICASONE PROPIONATE 50 MCG/ACT NA SUSP: 2.0000 | Freq: Every day | NASAL | 6 refills | Status: AC

## 2023-08-26 NOTE — Progress Notes (Unsigned)
 Dear Dr. Bernetta Brilliant, Here is my assessment for our mutual patient, [name]. Thank you for allowing me the opportunity to care for your patient. Please do not hesitate to contact me should you have any other questions. Sincerely, Belma Boxer PA-C  Otolaryngology Clinic Note Referring provider: Dr. Bernetta Brilliant HPI:  [name] is a 68 y.o. male kindly referred by Dr. Bernetta Brilliant   The patient ***    Right ear, feels like it needs to pop [place]in [place],  hear heart beat in the ear a lot  Does click and pop, left ear will pop  Couple itmes per day  Stopps up when eexercising  Has been going a year or so  Has gotten worse  As a kid no issues with ears, had tonisl out-   Doe shave some trouble hearing, both ears feel the same  No pain  No seasonla allergie  Non smoker   No sore throat, no fevers weight loss   No nasal congestion.   Has worked Nurse, adult,   Sitting up breathes throuhg nose, at night throuhg mouht, snores. No stopping of breathing, feels rested,   Not falling asleep in random places  Retired, par time work   Worse right sided nasal breathing when compared to the left  Independent Review of Additional Tests or Records:  Audiological evaluation   PMH/Meds/All/SocHx/FamHx/ROS:   Past Medical History:  Diagnosis Date   Diabetes mellitus, type 2 (HCC) 04/2011   Hyperlipidemia    Hypertension      *** The histories are not reviewed yet. Please review them in the "History" navigator section and refresh this SmartLink.  Family History  Problem Relation Age of Onset   Heart attack Father 32   Sudden death Mother 29     Social Connections: Not on file      Current Outpatient Medications:    amLODipine (NORVASC) 10 MG tablet, Take 0.5 tablets by mouth daily., Disp: , Rfl:    aspirin 81 MG tablet, Take 81 mg by mouth daily., Disp: , Rfl:    B-D 3CC LUER-LOK SYR 23GX1" 23G X 1" 3 ML MISC, USE AS DIRECTED EVERY 3 WEEKS 21, Disp:  , Rfl:    BD PEN NEEDLE NANO 2ND GEN 32G X 4 MM MISC, See admin instructions., Disp: , Rfl:    carvedilol (COREG) 6.25 MG tablet, Take 3.125 mg by mouth 2 (two) times daily., Disp: , Rfl:    colchicine 0.6 MG tablet, Take by mouth., Disp: , Rfl:    fenofibrate 160 MG tablet, Take 160 mg by mouth daily., Disp: , Rfl:    hydrochlorothiazide (HYDRODIURIL) 12.5 MG tablet, Take 12.5 mg by mouth daily., Disp: , Rfl:    losartan-hydrochlorothiazide (HYZAAR) 50-12.5 MG per tablet, Take 1 tablet by mouth daily., Disp: , Rfl:    metFORMIN (GLUCOPHAGE) 500 MG tablet, Take 1 tablet by mouth 2 (two) times daily., Disp: , Rfl:    omega-3 acid ethyl esters (LOVAZA) 1 g capsule, Take 2 capsules by mouth 2 (two) times daily., Disp: , Rfl:    Omega-3 Fatty Acids (FISH OIL) 1000 MG CAPS, Take by mouth daily., Disp: , Rfl:    omeprazole (PRILOSEC) 20 MG capsule, Take 1 capsule by mouth daily., Disp: , Rfl:    pravastatin (PRAVACHOL) 40 MG tablet, Take 40 mg by mouth daily., Disp: , Rfl:    terbinafine  (LAMISIL ) 250 MG tablet, Take 1 tablet (250 mg total) by mouth daily., Disp: 30 tablet, Rfl: 0  testosterone  cypionate (DEPOTESTOSTERONE CYPIONATE) 200 MG/ML injection, SMARTSIG:1 Milliliter(s) IM Every 3 Weeks, Disp: , Rfl:    TRESIBA FLEXTOUCH 200 UNIT/ML FlexTouch Pen, SMARTSIG:0-130 Unit(s) SUB-Q Daily, Disp: , Rfl:    VYTORIN 10-20 MG per tablet, Take 1 tablet by mouth daily., Disp: , Rfl: 1   Physical Exam:   BP (!) 175/75   Pulse 67   Ht 5\' 9"  (1.753 m)   Wt 195 lb (88.5 kg)   SpO2 95%   BMI 28.80 kg/m   Pertinent Findings  CN II-XII intact Bilateral external auditory canals are clear, bilateral TMs intact with bilateral serous effusions Weber 512: equal Rinne 512: AC > BC b/l  Anterior rhinoscopy: Septum midline; left inferior turbinate hypertrophy, minimal right inferior turbinate hypertrophy No lesions of oral cavity/oropharynx; dentition within normal limits No obviously palpable neck  masses/lymphadenopathy/thyromegaly No respiratory distress or stridor    Seprately Identifiable Procedures:  None***  Impression & Plans:  [name] is a 68 y.o. male with the following   ***   - f/u ***  Scope if still lfuid, just tymps at next visit   Thank you for allowing me the opportunity to care for your patient. Please do not hesitate to contact me should you have any other questions.  Sincerely, Belma Boxer PA-C  [facility] Phone: [phone] Fax: [phone]  08/26/2023, 10:19 AM

## 2023-08-30 ENCOUNTER — Encounter (INDEPENDENT_AMBULATORY_CARE_PROVIDER_SITE_OTHER): Payer: Self-pay | Admitting: Physician Assistant

## 2023-08-30 NOTE — Progress Notes (Signed)
  [address], [address] [place], [place] [address] [phone]  Audiological Evaluation    Name: [name]     DOB:   September 21, 1955      MRN:   [id number]                                                                                     Service Date: 08/26/2023         Patient comes today after Danny Rocker, PA-C sent a referral for a hearing evaluation due to concerns with Eustachian tube dysfunction.   Symptoms Yes Details  Hearing loss  [x]  Difficulty hearing when there os background noise.   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 Left ear:  Clear external ear canals and notable landmarks visualized on the tympanic membrane.  Tympanometry: Right ear: Type A- Normal external ear canal volume with normal middle ear pressure and tympanic membrane compliance. Left ear: Type A- Normal external ear canal volume with normal middle ear pressure and tympanic membrane compliance.   Pure tone Audiometry: Both ears: Normal sloping to severe sensorineural hearing loss from 125 Hz - 8000 Hz.    Speech Audiometry: Right ear- Speech Reception Threshold (SRT) was obtained at 25 dBHL. Left ear-Speech Reception Threshold (SRT) was obtained at 25 dBHL.   Word Recognition Score Tested using NU-6 (MLV) Right ear: 84% was obtained at a presentation level of 80 dBHL with contralateral masking which is deemed as  good . Left ear: 80% was obtained at a presentation level of 85 dBHL with contralateral masking which is deemed as  good .   The hearing test results were completed under headphones and results  are deemed to be of good reliability. Test technique:  conventional     Recommendations: Follow up with ENT as scheduled for today. Return for a hearing evaluation if concerns with hearing changes arise or per MD recommendation. Use hearing protection when exposed to loud/damaging sounds.  Consider a communication needs assessment after medical clearance for hearing aids is obtained.   [name] [name], AUD

## 2023-09-06 ENCOUNTER — Encounter: Payer: Self-pay | Admitting: Audiology

## 2023-11-26 ENCOUNTER — Ambulatory Visit (INDEPENDENT_AMBULATORY_CARE_PROVIDER_SITE_OTHER): Admitting: Physician Assistant

## 2023-11-26 VITALS — BP 167/79 | HR 65

## 2023-11-26 DIAGNOSIS — H6993 Unspecified Eustachian tube disorder, bilateral: Secondary | ICD-10-CM

## 2023-11-26 DIAGNOSIS — H938X1 Other specified disorders of right ear: Secondary | ICD-10-CM

## 2023-11-26 NOTE — Progress Notes (Signed)
 Dear Dr. Loreli, Here is my assessment for our mutual [name] [name] [name]. Thank you for allowing me [name] opportunity to care for your [name]. Please do not hesitate to contact me should you have any other questions. Sincerely, Chyrl Cohen PA-C  Otolaryngology Clinic Note Referring provider: Dr. Loreli HPI:  [name] [name] is a 68 y.o. male kindly referred by Dr. Loreli   [name] [name] is a 68 year old gentleman seen in our office for follow-up evaluation of eustachian tube dysfunction.  [name] [name] was last seen in [name] office on 08/26/2003 close to recap of that encounter.  [name] [name] is a 68 year old male seen in our office for evaluation of ear fullness.  [name] [name] reports that over [name] last year he has had fullness in his ears, predominantly on [name] right.  He notes that when he [name] he feels like his ears need to pop, he notes that his left ear will pop along with clicking, [name] right ear does not feel like it can pop.  He notes that [name] symptoms often occur several times a day, he notes that when he exercises [name] symptoms are evident.  He notes slowly over time he feels like [name] it is getting worse.  He denies any pain in [name] ears, denies any drainage coming no infectious signs or symptoms.  He notes a tonsillectomy as a kid but had no issues with his ears.  He feels like he does have some issues with hearing out of both ears, he notes this is longstanding with no significant recent changes.  He notes he is a non-smoker, no seasonal allergies.  He is working part-time now, he has worked in [employer] of his life.  He does note a history of snoring, no apneic events at night, he wakes up feeling rested and does not fall asleep during [name] day.  He notes his right nose feels more congested when compared to [name] left.   Update 11/26/2023-  Since his last office visit he denies any significant worsening of his symptoms.  He notes he has been using [name] prescribed  medications and feels like he may have had slight improvement in symptoms.  He describes an echoing in [name] right ear that fluctuates, he continues to deny any pain, dizziness, or any other changes.     Independent Review of Additional Tests or Records:  none   PMH/Meds/All/SocHx/FamHx/ROS:   Past Medical History:  Diagnosis Date   Diabetes mellitus, type 2 (HCC) 04/2011   Hyperlipidemia    Hypertension      No past surgical history on file.  Family History  Problem Relation Age of Onset   Heart attack Father 9   Sudden death Mother 72     Social Connections: Not on file      Current Outpatient Medications:    amLODipine (NORVASC) 10 MG tablet, Take 0.5 tablets by mouth daily., Disp: , Rfl:    aspirin 81 MG tablet, Take 81 mg by mouth daily., Disp: , Rfl:    B-D 3CC LUER-LOK SYR 23GX1 23G X 1 3 ML MISC, USE AS DIRECTED EVERY 3 WEEKS 21, Disp: , Rfl:    BD PEN NEEDLE NANO 2ND GEN 32G X 4 MM MISC, See admin instructions., Disp: , Rfl:    carvedilol (COREG) 6.25 MG tablet, Take 3.125 mg by mouth 2 (two) times daily., Disp: , Rfl:    colchicine 0.6 MG tablet, Take by mouth., Disp: , Rfl:    fenofibrate 160  MG tablet, Take 160 mg by mouth daily., Disp: , Rfl:    fluticasone  (FLONASE ) 50 MCG/ACT nasal spray, Place 2 sprays into both nostrils daily., Disp: 16 g, Rfl: 6   hydrochlorothiazide (HYDRODIURIL) 12.5 MG tablet, Take 12.5 mg by mouth daily., Disp: , Rfl:    loratadine  (CLARITIN ) 10 MG tablet, Take 1 tablet (10 mg total) by mouth daily., Disp: 90 tablet, Rfl: 11   losartan-hydrochlorothiazide (HYZAAR) 50-12.5 MG per tablet, Take 1 tablet by mouth daily., Disp: , Rfl:    metFORMIN (GLUCOPHAGE) 500 MG tablet, Take 1 tablet by mouth 2 (two) times daily., Disp: , Rfl:    omega-3 acid ethyl esters (LOVAZA) 1 g capsule, Take 2 capsules by mouth 2 (two) times daily., Disp: , Rfl:    Omega-3 Fatty Acids (FISH OIL) 1000 MG CAPS, Take by mouth daily., Disp: , Rfl:    omeprazole  (PRILOSEC) 20 MG capsule, Take 1 capsule by mouth daily., Disp: , Rfl:    pravastatin (PRAVACHOL) 40 MG tablet, Take 40 mg by mouth daily., Disp: , Rfl:    terbinafine  (LAMISIL ) 250 MG tablet, Take 1 tablet (250 mg total) by mouth daily., Disp: 30 tablet, Rfl: 0   testosterone  cypionate (DEPOTESTOSTERONE CYPIONATE) 200 MG/ML injection, SMARTSIG:1 Milliliter(s) IM Every 3 Weeks, Disp: , Rfl:    TRESIBA FLEXTOUCH 200 UNIT/ML FlexTouch Pen, SMARTSIG:0-130 Unit(s) SUB-Q Daily, Disp: , Rfl:    VYTORIN 10-20 MG per tablet, Take 1 tablet by mouth daily., Disp: , Rfl: 1   Physical Exam:   There were no vitals taken for this visit.  PCN II-XII intact Bilateral external auditory canals are clear, bilateral TMs intact with well-pneumatized middle ear space Anterior rhinoscopy: Septum midline; left inferior turbinate hypertrophy, minimal right inferior turbinate hypertrophy No lesions of oral cavity/oropharynx; dentition within normal limits No obviously palpable neck masses/lymphadenopathy/thyromegaly No respiratory distress or stridor    Seprately Identifiable Procedures:  None  Impression & Plans:  [name] [name] is a 68 y.o. male with [name] following   Ear fullness-  Persistent right-sided ear fullness.  [name] [name] notes that [name] symptoms have slightly improved but no significant changes.  He notes this does not cause significant impact on his daily life.  At this time I discussed [name] symptoms are most consistent with eustachian tube dysfunction.  I would recommend continue using [name] Flonase , daily antihistamine as well as nasal saline irrigation.  If he has no significant improvement after 6 months he may discontinue this.  I recommend he follow-up in [name] clinic if [name] symptoms are continuing to bother him and we can discuss further options moving forward.  [name] [name] verbalized understanding and agreement to today's plan had no further questions or concerns.   - f/u PRN   Thank you for  allowing me [name] opportunity to care for your [name]. Please do not hesitate to contact me should you have any other questions.  Sincerely, Chyrl Cohen PA-C [facility] Phone: [phone] Fax: [phone]  11/26/2023, 8:53 AM
